# Patient Record
Sex: Male | Born: 1990 | Race: Black or African American | Hispanic: No | Marital: Single | Smoking: Current every day smoker
Health system: Southern US, Community
[De-identification: ages and names within clinical notes are randomized; demographics above are authoritative.]

## PROBLEM LIST (undated history)

## (undated) DIAGNOSIS — S2249XA Multiple fractures of ribs, unspecified side, initial encounter for closed fracture: Secondary | ICD-10-CM

## (undated) HISTORY — PX: OTHER SURGICAL HISTORY: SHX169

## (undated) HISTORY — DX: Multiple fractures of ribs, unspecified side, initial encounter for closed fracture: S22.49XA

---
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
## 2010-05-14 ENCOUNTER — Emergency Department (HOSPITAL_COMMUNITY)
Admission: EM | Admit: 2010-05-14 | Discharge: 2010-05-14 | Payer: Self-pay | Source: Home / Self Care | Admitting: Emergency Medicine

## 2010-08-06 LAB — BASIC METABOLIC PANEL
BUN: 12 mg/dL (ref 6–23)
CO2: 29 mEq/L (ref 19–32)
Calcium: 9.2 mg/dL (ref 8.4–10.5)
GFR calc non Af Amer: >60 mL/min (ref >60)
Glucose, Bld: 90 mg/dL (ref 70–99)

## 2010-08-06 LAB — HEPATIC FUNCTION PANEL
ALT: 16 U/L (ref 0–53)
Alkaline Phosphatase: 45 U/L (ref 39–117)
Indirect Bilirubin: 1.1 mg/dL — ABNORMAL HIGH (ref 0.3–0.9)
Total Protein: 7.4 g/dL (ref 6.0–8.3)

## 2010-08-06 LAB — URINALYSIS, ROUTINE W REFLEX MICROSCOPIC
Bilirubin Urine: NEGATIVE
Glucose, UA: NEGATIVE mg/dL
Hgb urine dipstick: NEGATIVE
Ketones, ur: 15 mg/dL — AB
Protein, ur: NEGATIVE mg/dL

## 2010-08-06 LAB — CBC
MCH: 34.2 pg — ABNORMAL HIGH (ref 26.0–34.0)
MCHC: 36.2 g/dL — ABNORMAL HIGH (ref 30.0–36.0)
RDW: 12 % (ref 11.5–15.5)

## 2010-08-06 LAB — DIFFERENTIAL
Eosinophils Relative: 1 % (ref 0–5)
Lymphs Abs: 0.5 10*3/uL — ABNORMAL LOW (ref 0.7–4.0)
Neutro Abs: 6.3 10*3/uL (ref 1.7–7.7)
Neutrophils Relative %: 87 % — ABNORMAL HIGH (ref 43–77)

## 2018-03-09 ENCOUNTER — Emergency Department (HOSPITAL_COMMUNITY): Payer: Self-pay

## 2018-03-09 ENCOUNTER — Encounter (HOSPITAL_COMMUNITY): Payer: Self-pay | Admitting: Emergency Medicine

## 2018-03-09 ENCOUNTER — Emergency Department (HOSPITAL_COMMUNITY)
Admission: EM | Admit: 2018-03-09 | Discharge: 2018-03-09 | Disposition: A | Discharge status: Home / Self Care | Payer: Self-pay | Attending: Emergency Medicine | Admitting: Emergency Medicine

## 2018-03-09 DIAGNOSIS — Y999 Unspecified external cause status: Secondary | ICD-10-CM | POA: Insufficient documentation

## 2018-03-09 DIAGNOSIS — W19XXXA Unspecified fall, initial encounter: Secondary | ICD-10-CM | POA: Insufficient documentation

## 2018-03-09 DIAGNOSIS — Z79899 Other long term (current) drug therapy: Secondary | ICD-10-CM | POA: Insufficient documentation

## 2018-03-09 DIAGNOSIS — Y929 Unspecified place or not applicable: Secondary | ICD-10-CM | POA: Insufficient documentation

## 2018-03-09 DIAGNOSIS — Z23 Encounter for immunization: Secondary | ICD-10-CM | POA: Insufficient documentation

## 2018-03-09 DIAGNOSIS — Y939 Activity, unspecified: Secondary | ICD-10-CM | POA: Insufficient documentation

## 2018-03-09 DIAGNOSIS — F17200 Nicotine dependence, unspecified, uncomplicated: Secondary | ICD-10-CM | POA: Insufficient documentation

## 2018-03-09 DIAGNOSIS — S0081XA Abrasion of other part of head, initial encounter: Secondary | ICD-10-CM | POA: Insufficient documentation

## 2018-03-09 DIAGNOSIS — S0990XA Unspecified injury of head, initial encounter: Secondary | ICD-10-CM

## 2018-03-09 LAB — COMPREHENSIVE METABOLIC PANEL
ALK PHOS: 38 U/L (ref 38–126)
ALT: 19 U/L (ref 0–44)
ANION GAP: 9 (ref 5–15)
AST: 24 U/L (ref 15–41)
Albumin: 4.2 g/dL (ref 3.5–5.0)
BILIRUBIN TOTAL: 0.9 mg/dL (ref 0.3–1.2)
BUN: 13 mg/dL (ref 6–20)
CALCIUM: 9.5 mg/dL (ref 8.9–10.3)
CO2: 31 mmol/L (ref 22–32)
CREATININE: 1.08 mg/dL (ref 0.61–1.24)
Chloride: 102 mmol/L (ref 98–111)
GFR calc non Af Amer: >60 mL/min (ref >60)
GLUCOSE: 121 mg/dL — AB (ref 70–99)
Potassium: 4.3 mmol/L (ref 3.5–5.1)
SODIUM: 142 mmol/L (ref 135–145)
TOTAL PROTEIN: 7.6 g/dL (ref 6.5–8.1)

## 2018-03-09 LAB — URINALYSIS, ROUTINE W REFLEX MICROSCOPIC
Bilirubin Urine: NEGATIVE
Glucose, UA: NEGATIVE mg/dL
Hgb urine dipstick: NEGATIVE
Ketones, ur: NEGATIVE mg/dL
LEUKOCYTES UA: NEGATIVE
NITRITE: NEGATIVE
PROTEIN: NEGATIVE mg/dL
Specific Gravity, Urine: 1.027 (ref 1.005–1.030)
pH: 6 (ref 5.0–8.0)

## 2018-03-09 LAB — RAPID URINE DRUG SCREEN, HOSP PERFORMED
Amphetamines: NOT DETECTED
BARBITURATES: NOT DETECTED
BENZODIAZEPINES: NOT DETECTED
Cocaine: NOT DETECTED
Opiates: NOT DETECTED
Tetrahydrocannabinol: POSITIVE — AB

## 2018-03-09 LAB — CBC WITH DIFFERENTIAL/PLATELET
ABS IMMATURE GRANULOCYTES: 0.02 10*3/uL (ref 0.00–0.07)
Basophils Absolute: 0 10*3/uL (ref 0.0–0.1)
Basophils Relative: 0 %
Eosinophils Absolute: 0 10*3/uL (ref 0.0–0.5)
Eosinophils Relative: 1 %
HEMATOCRIT: 46.8 % (ref 39.0–52.0)
HEMOGLOBIN: 16 g/dL (ref 13.0–17.0)
Immature Granulocytes: 0 %
LYMPHS ABS: 0.9 10*3/uL (ref 0.7–4.0)
LYMPHS PCT: 11 %
MCH: 32.9 pg (ref 26.0–34.0)
MCHC: 34.2 g/dL (ref 30.0–36.0)
MCV: 96.3 fL (ref 80.0–100.0)
MONOS PCT: 6 %
Monocytes Absolute: 0.5 10*3/uL (ref 0.1–1.0)
NEUTROS ABS: 6.6 10*3/uL (ref 1.7–7.7)
Neutrophils Relative %: 82 %
Platelets: 186 10*3/uL (ref 150–400)
RBC: 4.86 MIL/uL (ref 4.22–5.81)
RDW: 11.8 % (ref 11.5–15.5)
WBC: 8.1 10*3/uL (ref 4.0–10.5)
nRBC: 0 % (ref 0.0–0.2)

## 2018-03-09 LAB — ETHANOL

## 2018-03-09 MED ORDER — BACITRACIN ZINC 500 UNIT/GM EX OINT: 1.0000 "application " | TOPICAL_OINTMENT | Freq: Two times a day (BID) | CUTANEOUS | 0 refills | Status: DC

## 2018-03-09 MED ORDER — FLUORESCEIN SODIUM 1 MG OP STRP
1.0000 | ORAL_STRIP | Freq: Once | OPHTHALMIC | Status: AC
  Administered 2018-03-09: 1 via OPHTHALMIC
  Filled 2018-03-09: qty 1

## 2018-03-09 MED ORDER — HYDROCODONE-ACETAMINOPHEN 5-325 MG PO TABS
1.0000 | ORAL_TABLET | Freq: Once | ORAL | Status: AC
  Administered 2018-03-09: 1 via ORAL
  Filled 2018-03-09: qty 1

## 2018-03-09 MED ORDER — TETRACAINE HCL 0.5 % OP SOLN
2.0000 [drp] | Freq: Once | OPHTHALMIC | Status: AC
  Administered 2018-03-09: 2 [drp] via OPHTHALMIC
  Filled 2018-03-09: qty 4

## 2018-03-09 MED ORDER — SODIUM CHLORIDE 0.9 % IV BOLUS
1000.0000 mL | Freq: Once | INTRAVENOUS | Status: AC
  Administered 2018-03-09: 1000 mL via INTRAVENOUS

## 2018-03-09 MED ORDER — HYDROCODONE-ACETAMINOPHEN 5-325 MG PO TABS: 1.0000 | ORAL_TABLET | Freq: Four times a day (QID) | ORAL | 0 refills | Status: DC | PRN

## 2018-03-09 MED ORDER — TETANUS-DIPHTH-ACELL PERTUSSIS 5-2.5-18.5 LF-MCG/0.5 IM SUSP
0.5000 mL | Freq: Once | INTRAMUSCULAR | Status: AC
  Administered 2018-03-09: 0.5 mL via INTRAMUSCULAR
  Filled 2018-03-09: qty 0.5

## 2018-03-09 MED ORDER — CEPHALEXIN 500 MG PO CAPS: 500.0000 mg | ORAL_CAPSULE | Freq: Three times a day (TID) | ORAL | 0 refills | Status: DC

## 2018-03-09 NOTE — ED Triage Notes (Signed)
Per pt, states he fell "form exhaustion" last night-facial lacerations/abrasions to rigth side of face and right hand/knuckles-right eye, lip/mouth swollen-bleeding controlled at this time

## 2018-03-09 NOTE — ED Provider Notes (Signed)
[facility]-EMERGENCY DEPT Provider Note   CSN: [id number] Arrival date & time: 03/09/18  1058     History   Chief Complaint Chief Complaint  Patient presents with  . Fall    HPI Dillon Nichols is a 27 y.o. male.  HPI Patient complains of right-sided facial pain after falling earlier this morning around 3 AM.  States he felt lightheaded prior to fall.  Unknown loss of consciousness.  Supposedly there was a weakness to the event and patient had no seizure-like activity.  Sustained laceration under the right eye which mother cleaned with peroxide this morning.  Patient denies any eye pain or visual changes.  No nausea or vomiting.  No focal weakness or numbness.  Denies neck pain.  Unknown last tetanus. History reviewed. No pertinent past medical history.  There are no active problems to display for this patient.   History reviewed. No pertinent surgical history.      Home Medications    Prior to Admission medications   Medication Sig Start Date End Date Taking? Authorizing Provider  acetaminophen (TYLENOL) 500 MG tablet Take 500 mg by mouth daily as needed (pain).   Yes [provider]  bacitracin ointment Apply 1 application topically 2 (two) times daily. Apply to facial abrasions 03/09/18   Loren Racer, MD  cephALEXin (KEFLEX) 500 MG capsule Take 1 capsule (500 mg total) by mouth 3 (three) times daily. 03/09/18   Loren Racer, MD  HYDROcodone-acetaminophen (NORCO) 5-325 MG tablet Take 1 tablet by mouth every 6 (six) hours as needed for severe pain. 03/09/18   Loren Racer, MD    Family History No family history on file.  Social History Social History   Tobacco Use  . Smoking status: Current Some Day Smoker  Substance Use Topics  . Alcohol use: Yes  . Drug use: Not on file     Allergies   Patient has no allergy information on record.   Review of Systems Review of Systems  Constitutional: Negative for chills and  fever.  HENT: Positive for facial swelling. Negative for sore throat and trouble swallowing.   Eyes: Negative for photophobia, pain and visual disturbance.  Respiratory: Negative for shortness of breath.   Cardiovascular: Negative for chest pain.  Gastrointestinal: Negative for abdominal pain, diarrhea, nausea, rectal pain and vomiting.  Genitourinary: Negative for dysuria, flank pain and frequency.  Musculoskeletal: Negative for back pain, myalgias and neck pain.  Skin: Positive for wound.  Neurological: Positive for light-headedness and headaches. Negative for weakness and numbness.  All other systems reviewed and are negative.    Physical Exam Updated Vital Signs BP 125/80 (BP Location: Left Arm)   Pulse (!) 114   Temp 98.3 F (36.8 C) (Oral)   Resp 16   Ht 5\' 11"  (1.803 m)   Wt 72.6 kg   SpO2 95%   BMI 22.32 kg/m   Physical Exam  Constitutional: He is oriented to person, place, and time. He appears well-developed and well-nourished. No distress.  HENT:  Head: Normocephalic.  Mouth/Throat: Oropharynx is clear and moist.  Patient with roughly 6 to 7 centimeter of abraded tissue just inferior to the right lower eyelid.  Right upper lip swelling.  Midface is stable.  No malocclusion.  Patient also has a right forehead hematoma.  Eyes: Pupils are equal, round, and reactive to light. EOM are normal.  Pupils equal round reactive to light.  No hyphema present.  No foreign bodies visualized. No fluorescein uptake on Woods lamp  exam  Neck: Normal range of motion. Neck supple.  No posterior midline cervical tenderness to palpation.  Cardiovascular: Normal rate and regular rhythm.  Pulmonary/Chest: Effort normal and breath sounds normal.  Abdominal: Soft. Bowel sounds are normal. There is no tenderness. There is no rebound and no guarding.  Musculoskeletal: Normal range of motion. He exhibits no edema or tenderness.  Neurological: He is oriented to person, place, and time.  Drowsy  but responsive to voice.  5/5 motor in all extremities.  Sensation to light touch intact.  Skin: Skin is warm and dry. No rash noted. He is not diaphoretic. No erythema.  Small abrasions to the right hand.  Psychiatric: He has a normal mood and affect. His behavior is normal.  Nursing note and vitals reviewed.    ED Treatments / Results  Labs (all labs ordered are listed, but only abnormal results are displayed) Labs Reviewed  COMPREHENSIVE METABOLIC PANEL - Abnormal; Notable for the following components:      Result Value   Glucose, Bld 121 (*)    All other components within normal limits  RAPID URINE DRUG SCREEN, HOSP PERFORMED - Abnormal; Notable for the following components:   Tetrahydrocannabinol POSITIVE (*)    All other components within normal limits  CBC WITH DIFFERENTIAL/PLATELET  ETHANOL  URINALYSIS, ROUTINE W REFLEX MICROSCOPIC    EKG None  Radiology Ct Head Wo Contrast  Result Date: 03/09/2018 CLINICAL DATA:  27 year old male with a history of fall EXAM: CT HEAD WITHOUT CONTRAST CT MAXILLOFACIAL WITHOUT CONTRAST CT CERVICAL SPINE WITHOUT CONTRAST TECHNIQUE: Multidetector CT imaging of the head, cervical spine, and maxillofacial structures were performed using the standard protocol without intravenous contrast. Multiplanar CT image reconstructions of the cervical spine and maxillofacial structures were also generated. COMPARISON:  None. FINDINGS: CT HEAD FINDINGS Brain: No acute intracranial hemorrhage. No midline shift or mass effect. Gray-white differentiation maintained. Unremarkable appearance of the ventricular system. Vascular: Unremarkable. Skull: No acute fracture.  No aggressive bone lesion identified. Sinuses/Orbits: Unremarkable appearance of the orbits. Mastoid air cells clear. No middle ear effusion. No significant sinus disease. Other: None CT MAXILLOFACIAL FINDINGS Osseous: Age indeterminate nondisplaced fracture of the right nasal bone. No acute fracture of  the maxilla, mandible, zygoma or nasal septum. Orbits: Negative. No traumatic or inflammatory finding. Sinuses: Clear. Soft tissues: Soft tissue swelling in the right malar soft tissues. No radiopaque foreign body. No underlying maxillary bone fracture. At least 2 sites of dental caries. CT CERVICAL SPINE FINDINGS Alignment: Craniocervical junction aligned. Anatomic alignment of the cervical elements. No subluxation. Skull base and vertebrae: No acute fracture at the skullbase. Vertebral body heights relatively maintained. No acute fracture identified. Soft tissues and spinal canal: Unremarkable cervical soft tissues. Lymph nodes are present, though not enlarged. Disc levels: Unremarkable appearance of disc space, which are maintained. Upper chest: Unremarkable appearance of the lung apices. Other: No bony canal narrowing. IMPRESSION: Head CT: No acute intracranial abnormality. Cervical CT: No acute fracture malalignment of the cervical spine. Maxillofacial CT: Age-indeterminate nondisplaced fracture of the right nasal bone. Soft tissue swelling in the right malar soft tissues. At least 2 sites of dental caries. Electronically Signed   By: Gilmer Mor D.O.   On: 03/09/2018 14:00   Ct Cervical Spine Wo Contrast  Result Date: 03/09/2018 CLINICAL DATA:  27 year old male with a history of fall EXAM: CT HEAD WITHOUT CONTRAST CT MAXILLOFACIAL WITHOUT CONTRAST CT CERVICAL SPINE WITHOUT CONTRAST TECHNIQUE: Multidetector CT imaging of the head, cervical spine, and maxillofacial structures  were performed using the standard protocol without intravenous contrast. Multiplanar CT image reconstructions of the cervical spine and maxillofacial structures were also generated. COMPARISON:  None. FINDINGS: CT HEAD FINDINGS Brain: No acute intracranial hemorrhage. No midline shift or mass effect. Gray-white differentiation maintained. Unremarkable appearance of the ventricular system. Vascular: Unremarkable. Skull: No acute  fracture.  No aggressive bone lesion identified. Sinuses/Orbits: Unremarkable appearance of the orbits. Mastoid air cells clear. No middle ear effusion. No significant sinus disease. Other: None CT MAXILLOFACIAL FINDINGS Osseous: Age indeterminate nondisplaced fracture of the right nasal bone. No acute fracture of the maxilla, mandible, zygoma or nasal septum. Orbits: Negative. No traumatic or inflammatory finding. Sinuses: Clear. Soft tissues: Soft tissue swelling in the right malar soft tissues. No radiopaque foreign body. No underlying maxillary bone fracture. At least 2 sites of dental caries. CT CERVICAL SPINE FINDINGS Alignment: Craniocervical junction aligned. Anatomic alignment of the cervical elements. No subluxation. Skull base and vertebrae: No acute fracture at the skullbase. Vertebral body heights relatively maintained. No acute fracture identified. Soft tissues and spinal canal: Unremarkable cervical soft tissues. Lymph nodes are present, though not enlarged. Disc levels: Unremarkable appearance of disc space, which are maintained. Upper chest: Unremarkable appearance of the lung apices. Other: No bony canal narrowing. IMPRESSION: Head CT: No acute intracranial abnormality. Cervical CT: No acute fracture malalignment of the cervical spine. Maxillofacial CT: Age-indeterminate nondisplaced fracture of the right nasal bone. Soft tissue swelling in the right malar soft tissues. At least 2 sites of dental caries. Electronically Signed   By: Gilmer Mor D.O.   On: 03/09/2018 14:00   Ct Maxillofacial Wo Contrast  Result Date: 03/09/2018 CLINICAL DATA:  27 year old male with a history of fall EXAM: CT HEAD WITHOUT CONTRAST CT MAXILLOFACIAL WITHOUT CONTRAST CT CERVICAL SPINE WITHOUT CONTRAST TECHNIQUE: Multidetector CT imaging of the head, cervical spine, and maxillofacial structures were performed using the standard protocol without intravenous contrast. Multiplanar CT image reconstructions of the  cervical spine and maxillofacial structures were also generated. COMPARISON:  None. FINDINGS: CT HEAD FINDINGS Brain: No acute intracranial hemorrhage. No midline shift or mass effect. Gray-white differentiation maintained. Unremarkable appearance of the ventricular system. Vascular: Unremarkable. Skull: No acute fracture.  No aggressive bone lesion identified. Sinuses/Orbits: Unremarkable appearance of the orbits. Mastoid air cells clear. No middle ear effusion. No significant sinus disease. Other: None CT MAXILLOFACIAL FINDINGS Osseous: Age indeterminate nondisplaced fracture of the right nasal bone. No acute fracture of the maxilla, mandible, zygoma or nasal septum. Orbits: Negative. No traumatic or inflammatory finding. Sinuses: Clear. Soft tissues: Soft tissue swelling in the right malar soft tissues. No radiopaque foreign body. No underlying maxillary bone fracture. At least 2 sites of dental caries. CT CERVICAL SPINE FINDINGS Alignment: Craniocervical junction aligned. Anatomic alignment of the cervical elements. No subluxation. Skull base and vertebrae: No acute fracture at the skullbase. Vertebral body heights relatively maintained. No acute fracture identified. Soft tissues and spinal canal: Unremarkable cervical soft tissues. Lymph nodes are present, though not enlarged. Disc levels: Unremarkable appearance of disc space, which are maintained. Upper chest: Unremarkable appearance of the lung apices. Other: No bony canal narrowing. IMPRESSION: Head CT: No acute intracranial abnormality. Cervical CT: No acute fracture malalignment of the cervical spine. Maxillofacial CT: Age-indeterminate nondisplaced fracture of the right nasal bone. Soft tissue swelling in the right malar soft tissues. At least 2 sites of dental caries. Electronically Signed   By: Gilmer Mor D.O.   On: 03/09/2018 14:00    Procedures Procedures (including  critical care time)  Medications Ordered in ED Medications  Tdap  (BOOSTRIX) injection 0.5 mL (0.5 mLs Intramuscular Given 03/09/18 1312)  sodium chloride 0.9 % bolus 1,000 mL (0 mLs Intravenous Stopped 03/09/18 1346)  HYDROcodone-acetaminophen (NORCO/VICODIN) 5-325 MG per tablet 1 tablet (1 tablet Oral Given 03/09/18 1511)  tetracaine (PONTOCAINE) 0.5 % ophthalmic solution 2 drop (2 drops Right Eye Given 03/09/18 1522)  fluorescein ophthalmic strip 1 strip (1 strip Right Eye Given 03/09/18 1522)     Initial Impression / Assessment and Plan / ED Course  I have reviewed the triage vital signs and the nursing notes.  Pertinent labs & imaging results that were available during my care of the patient were reviewed by me and considered in my medical decision making (see chart for details).     Abrasion to the right face.  Wound was irrigated in the emergency department.  Not amenable to surgical closure given length of time since injury and also tissue loss.  Will start antibiotics and topical bacitracin.  CT head without acute findings.  Patient is been given head injury precautions and is voiced understanding.  Final Clinical Impressions(s) / ED Diagnoses   Final diagnoses:  Fall from standing, initial encounter  Facial abrasion, initial encounter  Closed head injury, initial encounter    ED Discharge Orders         Ordered    HYDROcodone-acetaminophen (NORCO) 5-325 MG tablet  Every 6 hours PRN     03/09/18 1535    cephALEXin (KEFLEX) 500 MG capsule  3 times daily     03/09/18 1535    bacitracin ointment  2 times daily     03/09/18 1535           Loren Racer, MD 03/09/18 1537

## 2018-04-16 ENCOUNTER — Encounter (INDEPENDENT_AMBULATORY_CARE_PROVIDER_SITE_OTHER): Payer: Self-pay | Admitting: Physician Assistant

## 2018-04-16 ENCOUNTER — Ambulatory Visit (INDEPENDENT_AMBULATORY_CARE_PROVIDER_SITE_OTHER): Payer: Self-pay | Admitting: Physician Assistant

## 2018-04-16 VITALS — BP 106/68 | HR 95 | Temp 98.0°F | Ht 71.0 in | Wt 155.0 lb

## 2018-04-16 DIAGNOSIS — R55 Syncope and collapse: Secondary | ICD-10-CM

## 2018-04-16 DIAGNOSIS — Z114 Encounter for screening for human immunodeficiency virus [HIV]: Secondary | ICD-10-CM

## 2018-04-16 DIAGNOSIS — Z09 Encounter for follow-up examination after completed treatment for conditions other than malignant neoplasm: Secondary | ICD-10-CM

## 2018-04-16 DIAGNOSIS — Z131 Encounter for screening for diabetes mellitus: Secondary | ICD-10-CM

## 2018-04-16 LAB — POCT GLYCOSYLATED HEMOGLOBIN (HGB A1C): HEMOGLOBIN A1C: 4.9 % (ref 4.0–5.6)

## 2018-04-16 NOTE — Progress Notes (Signed)
Subjective:  Patient ID: Dillon Nichols, male    DOB: March 14, 1991  Age: 27 y.o. MRN: [id number]  CC: hospital f/u  HPI Dillon Nichols is a 27 y.o. male with no significant medical history presents as a new patient on ED f/u. Went to ED five weeks ago after a syncopal episode with subsequent laceration under right eye. CT head, maxillofacial, and cervical spine unremarkable. UDS positive for THC. CBC, CMP, Ethanol, and UA normal/unremarkable. Laceration advised to heal under secondary intention. Discharged on cephalexin and bacitracin ointment. Pt took 3-4 days of Cephalexin. Does not feel his laceration is infected.     Pt's syncope occurred [place] [employer]. Does not think he was drugged at the club. Pt attributes syncope to exhaustion and possibly not eating three meals a day as he should. Also thinks he may be dehydrated. Has not had another syncopal episode since ED visit. Denies CP, palpitations, SOB, HA, tingling, numbness, abdominal pain, f/c/n/v, swelling, rash, or GI/GU sxs.        Outpatient Medications Prior to Visit  Medication Sig Dispense Refill  . bacitracin ointment Apply 1 application topically 2 (two) times daily. Apply to facial abrasions 120 g 0  . acetaminophen (TYLENOL) 500 MG tablet Take 500 mg by mouth daily as needed (pain).    . cephALEXin (KEFLEX) 500 MG capsule Take 1 capsule (500 mg total) by mouth 3 (three) times daily. 21 capsule 0  . HYDROcodone-acetaminophen (NORCO) 5-325 MG tablet Take 1 tablet by mouth every 6 (six) hours as needed for severe pain. 10 tablet 0   No facility-administered medications prior to visit.      ROS Review of Systems  Constitutional: Negative for chills, fever and malaise/fatigue.  Eyes: Negative for blurred vision.  Respiratory: Negative for shortness of breath.   Cardiovascular: Negative for chest pain and palpitations.  Gastrointestinal: Negative for abdominal pain and nausea.  Genitourinary: Negative for  dysuria and hematuria.  Musculoskeletal: Negative for joint pain and myalgias.  Skin: Negative for rash.  Neurological: Negative for tingling and headaches.  Psychiatric/Behavioral: Negative for depression. The patient is not nervous/anxious.     Objective:  Ht 5\' 11"  (1.803 m)   Wt 155 lb (70.3 kg)   BMI 21.62 kg/m   BP/Weight 04/16/2018 03/09/2018  Systolic BP - 116  Diastolic BP - 78  Wt. (Lbs) 155 160  BMI 21.62 22.32      Physical Exam  Constitutional: He is oriented to person, place, and time.  Well developed, well nourished, NAD, polite  HENT:  Head: Normocephalic and atraumatic.  Eyes: No scleral icterus.  Neck: Normal range of motion. Neck supple. No thyromegaly present.  Cardiovascular: Normal rate, regular rhythm and normal heart sounds.  Pulmonary/Chest: Effort normal and breath sounds normal.  Abdominal: Soft. Bowel sounds are normal. There is no tenderness.  Musculoskeletal: He exhibits no edema.  Neurological: He is alert and oriented to person, place, and time.  Skin: Skin is warm and dry. No rash noted. No erythema. No pallor.  Psychiatric: He has a normal mood and affect. His behavior is normal. Thought content normal.  Vitals reviewed.    Assessment & Plan:    1. Hospital discharge follow-up - Notes reviewed  2. Syncope and collapse - Work up thus far negative. See HPI.  - TSH  3. Screening for diabetes mellitus - HgB A1c 4.9  4. Screening for HIV (human immunodeficiency virus) - HIV Antibody (routine testing w rflx)     Follow-up:  Return in about 1 year (around 04/17/2019) for Full physical exam.   Loletta Specteroger David Kerrigan Gombos PA

## 2018-04-16 NOTE — Patient Instructions (Signed)
Syncope Syncope is when you lose temporarily pass out (faint). Signs that you may be about to pass out include:  Feeling dizzy or light-headed.  Feeling sick to your stomach (nauseous).  Seeing all white or all black.  Having cold, clammy skin.  If you passed out, get help right away. Call your local emergency services (911 in the [place] Do not drive yourself to the hospital. Follow these instructions at home: Pay attention to any changes in your symptoms. Take these actions to help with your condition:  Have someone stay with you until you feel stable.  Do not drive, use machinery, or play sports until your doctor says it is okay.  Keep all follow-up visits as told by your doctor. This is important.  If you start to feel like you might pass out, lie down right away and raise (elevate) your feet above the level of your heart. Breathe deeply and steadily. Wait until all of the symptoms are gone.  Drink enough fluid to keep your pee (urine) clear or pale yellow.  If you are taking blood pressure or heart medicine, get up slowly and spend many minutes getting ready to sit and then stand. This can help with dizziness.  Take over-the-counter and prescription medicines only as told by your doctor.  Get help right away if:  You have a very bad headache.  You have unusual pain in your chest, tummy, or back.  You are bleeding from your mouth or rectum.  You have black or tarry poop (stool).  You have a very fast or uneven heartbeat (palpitations).  It hurts to breathe.  You pass out once or more than once.  You have jerky movements that you cannot control (seizure).  You are confused.  You have trouble walking.  You are very weak.  You have vision problems. These symptoms may be an emergency. Do not wait to see if the symptoms will go away. Get medical help right away. Call your local emergency services (911 in the [place] Do not drive yourself to the hospital. This  information is not intended to replace advice given to you by your health care provider. Make sure you discuss any questions you have with your health care provider. Document Released: 10/30/2007 Document Revised: 10/19/2015 Document Reviewed: 01/25/2015 Elsevier Interactive Patient Education  2018 [employer]  

## 2018-04-17 ENCOUNTER — Telehealth (INDEPENDENT_AMBULATORY_CARE_PROVIDER_SITE_OTHER): Payer: Self-pay

## 2018-04-17 LAB — HIV ANTIBODY (ROUTINE TESTING W REFLEX): HIV SCREEN 4TH GENERATION: NONREACTIVE

## 2018-04-17 LAB — TSH: TSH: 1.38 u[IU]/mL (ref 0.450–4.500)

## 2018-04-17 NOTE — Telephone Encounter (Signed)
Patient verified DOB. He is aware that his thyroid is normal and HIV negative. Maryjean Mornempestt S Alfonzia Woolum, CMA

## 2018-04-17 NOTE — Telephone Encounter (Signed)
-----   Message from Loletta Specteroger David Gomez, PA-C sent at 04/17/2018  8:41 AM EST ----- Normal Thyroid. Negative HIV.

## 2019-03-12 ENCOUNTER — Other Ambulatory Visit: Payer: Self-pay

## 2019-03-12 DIAGNOSIS — Z20822 Contact with and (suspected) exposure to covid-19: Secondary | ICD-10-CM

## 2019-03-14 LAB — NOVEL CORONAVIRUS, NAA: SARS-CoV-2, NAA: DETECTED — AB

## 2019-03-16 ENCOUNTER — Other Ambulatory Visit: Payer: Self-pay

## 2019-03-16 ENCOUNTER — Encounter (HOSPITAL_COMMUNITY): Payer: Self-pay | Admitting: Obstetrics and Gynecology

## 2019-03-16 ENCOUNTER — Emergency Department (HOSPITAL_COMMUNITY): Payer: Self-pay

## 2019-03-16 ENCOUNTER — Emergency Department (HOSPITAL_COMMUNITY)
Admission: EM | Admit: 2019-03-16 | Discharge: 2019-03-16 | Disposition: A | Discharge status: Home / Self Care | Payer: Self-pay | Attending: Emergency Medicine | Admitting: Emergency Medicine

## 2019-03-16 DIAGNOSIS — U071 COVID-19: Secondary | ICD-10-CM | POA: Insufficient documentation

## 2019-03-16 DIAGNOSIS — F17210 Nicotine dependence, cigarettes, uncomplicated: Secondary | ICD-10-CM | POA: Insufficient documentation

## 2019-03-16 NOTE — Discharge Instructions (Addendum)
You need to make sure that you are quarantining at home from 10 days since the onset of your symptoms.  If you develop a fever all day 10 need to quarantine for another 3 days.  Drink plenty of water and orange juice.  Need to stay hydrated.  Tylenol for pain and fevers.  If your symptoms worsen return to the ER.

## 2019-03-16 NOTE — ED Provider Notes (Signed)
[facility]-EMERGENCY DEPT Provider Note   CSN: [id number] Arrival date & time: 03/16/19  1248     History   Chief Complaint Chief Complaint  Patient presents with  . COVID +    HPI Dillon Nichols is a 28 y.o. male.     HPI 28 year old African-American male with no pertinent past medical history presents to the emergency department today for evaluation after being tested for COVID-19 on 10/16.  Patient reports 4 days ago he woke up with loss of taste and smell.  He reports some mild shortness of breath along with nonproductive cough at that time.  He reports some nasal congestion and a headache.  Had a COVID-19 test performed that was positive.  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  Patient states he has been quarantining at home for the past 4 days.  He states that he woke up this morning was concerned that he needed to do the right thing to make sure that his mom did not get sick as she does work in [employer], [employer].  Patient states that he went outside this morning and felt a little bit lightheaded.  He also has some sharp pain to his nose with deep inspiration.  Patient reports ongoing cough but denies any significant shortness of breath today.  Patient denies any chest pain.  Denies any vision changes or headache.  No history of PE/DVT, prolonged immobilization, recent hospitalization/surgeries, unilateral leg swelling or calf tenderness.  Patient does report marijuana and tobacco use.  Denies any nausea, vomiting or diarrhea.  Denies any difficulties with swallowing or breathing.  Denies any abdominal pain.  He is taken no medications for his symptoms.  He states that he thinks he is lightheaded because he has not eaten anything today or last night.  Patient states that he does feel like his taste, smell and appetite is improving. No past medical history on file.  There are no active problems to display for this patient.    Past Surgical History:  Procedure Laterality Date  . Rib          Home Medications    Prior to Admission medications   Medication Sig Start Date End Date Taking? Authorizing Provider  bacitracin ointment Apply 1 application topically 2 (two) times daily. Apply to facial abrasions 03/09/18   Loren RacerYelverton, David, MD    Family History No family history on file.  Social History Social History   Tobacco Use  . Smoking status: Current Every Day Smoker    Packs/day: 0.50    Years: 15.00    Pack years: 7.50    Types: Cigarettes  . Smokeless tobacco: Never Used  Substance Use Topics  . Alcohol use: Yes    Comment: Social  . Drug use: Yes    Types: Marijuana    Comment: Weekly     Allergies   Patient has no known allergies.   Review of Systems Review of Systems  Constitutional: Negative for chills and fever.  HENT: Positive for congestion, rhinorrhea and sinus pain. Negative for sore throat.   Respiratory: Positive for cough and shortness of breath (resolved).   Cardiovascular: Negative for chest pain.  Gastrointestinal: Negative for abdominal pain, diarrhea, nausea and vomiting.  Genitourinary: Negative for dysuria.  Musculoskeletal: Negative for myalgias.  Skin: Negative for rash.  Neurological: Positive for headaches (resolved).  Psychiatric/Behavioral: Negative for confusion.     Physical Exam Updated Vital Signs BP 104/79 (BP Location: Right  Arm)   Pulse 89   Temp 99 F (37.2 C) (Oral)   Resp 16   SpO2 96%   Physical Exam Vitals signs and nursing note reviewed.  Constitutional:      General: He is not in acute distress.    Appearance: He is well-developed. He is not ill-appearing or toxic-appearing.  HENT:     Head: Normocephalic and atraumatic.     Mouth/Throat:     Mouth: Mucous membranes are moist.     Pharynx: Oropharynx is clear.  Eyes:     General: No scleral icterus.       Right eye: No discharge.        Left eye: No discharge.  Neck:      Musculoskeletal: Normal range of motion. No neck rigidity.  Cardiovascular:     Rate and Rhythm: Normal rate and regular rhythm.     Pulses: Normal pulses.     Heart sounds: Normal heart sounds. No murmur. No friction rub. No gallop.   Pulmonary:     Effort: Pulmonary effort is normal. No respiratory distress.     Breath sounds: Normal breath sounds. No stridor. No wheezing, rhonchi or rales.  Chest:     Chest wall: No tenderness.  Musculoskeletal: Normal range of motion.  Skin:    General: Skin is warm and dry.     Capillary Refill: Capillary refill takes less than 2 seconds.     Coloration: Skin is not pale.  Neurological:     Mental Status: He is alert.     Comments: Speech clear, follows commands.  Moves all extremities.  Ambulates with normal gait.  No Romberg.  Psychiatric:        Behavior: Behavior normal.        Thought Content: Thought content normal.        Judgment: Judgment normal.      ED Treatments / Results  Labs (all labs ordered are listed, but only abnormal results are displayed) Labs Reviewed - No data to display  EKG None  Radiology Dg Chest Portable 1 View  Result Date: 03/16/2019 CLINICAL DATA:  COVID-19. EXAM: PORTABLE CHEST 1 VIEW COMPARISON:  None. FINDINGS: The heart size and mediastinal contours are within normal limits. Both lungs are clear. The visualized skeletal structures are unremarkable. IMPRESSION: No active disease. Electronically Signed   By: Lupita Raider M.D.   On: 03/16/2019 14:00    Procedures Procedures (including critical care time)  Medications Ordered in ED Medications - No data to display   Initial Impression / Assessment and Plan / ED Course  I have reviewed the triage vital signs and the nursing notes.  Pertinent labs & imaging results that were available during my care of the patient were reviewed by me and considered in my medical decision making (see chart for details).        Dillon Nichols was evaluated  in Emergency Department on 03/16/2019 for the symptoms described in the history of present illness. He was evaluated in the context of the global COVID-19 pandemic, which necessitated consideration that the patient might be at risk for infection with the SARS-CoV-2 virus that causes COVID-19. Institutional protocols and algorithms that pertain to the evaluation of patients at risk for COVID-19 are in a state of rapid change based on information released by regulatory bodies including the CDC and federal and state organizations. These policies and algorithms were followed during the patient's care in the ED.  Patient tested positive for Covid  4 days ago.  Reports improving symptoms however he still reports some nasal congestion with sharp pain to his nose, lightheadedness and cough.  Patient did not want to get his mom sick at home.  States that he feels like he is dehydrated not eating or drinking anything.  He states that his appetite and taste has somewhat improved.  Patient denies any chest pain.  Vital signs are reassuring.  Orthostatics were negative as performed by myself.  Lungs clear to auscultation bilaterally.  Heart regular rate and rhythm.  Neurovascularly intact in all extremities.  No focal neurological deficits.  Chest x-ray showed no signs of pneumonia.  Patient able to eat and drink and feels improved after this.  Discussed quarantining for 10 days since onset of symptoms.  Patient is PERC negative and presentation does not seem consistent with PE or any other life-threatening emergency including ACS, CVA, ICH, myocarditis, endocarditis, dissection, pneumothorax.  Pt is hemodynamically stable, in NAD, & able to ambulate in the ED. Evaluation does not show pathology that would require ongoing emergent intervention or inpatient treatment. I explained the diagnosis to the patient. Pain has been managed & has no complaints prior to dc. Pt is comfortable with above plan and is stable for discharge at  this time. All questions were answered prior to disposition. Strict return precautions for f/u to the ED were discussed. Encouraged follow up with PCP.    Final Clinical Impressions(s) / ED Diagnoses   Final diagnoses:  [id number]    ED Discharge Orders    None       Aaron Edelman 03/16/19 1528    Sherwood Gambler, MD 03/16/19 [phone]

## 2019-03-16 NOTE — ED Triage Notes (Signed)
Patient presents to the ED after being tested COVID positive yesterday. Patient reports he is worried about getting his mom sick and that she works with elderly people and he lives with her and became concerned. Patient reports he had lost his taste and smell, but it is starting to come back. Patient is alert and oriented.  Patient states he came here to quarantine and "take the steps to not get others sick"

## 2019-03-26 ENCOUNTER — Other Ambulatory Visit: Payer: Self-pay

## 2019-03-26 DIAGNOSIS — Z20822 Contact with and (suspected) exposure to covid-19: Secondary | ICD-10-CM

## 2019-03-27 LAB — NOVEL CORONAVIRUS, NAA: SARS-CoV-2, NAA: NOT DETECTED

## 2019-03-31 ENCOUNTER — Telehealth (INDEPENDENT_AMBULATORY_CARE_PROVIDER_SITE_OTHER): Payer: Self-pay | Admitting: Primary Care

## 2020-12-10 ENCOUNTER — Ambulatory Visit (HOSPITAL_COMMUNITY)
Admission: EM | Admit: 2020-12-10 | Discharge: 2020-12-10 | Disposition: A | Discharge status: Home / Self Care | Payer: Self-pay | Attending: Physician Assistant | Admitting: Physician Assistant

## 2020-12-10 ENCOUNTER — Encounter (HOSPITAL_COMMUNITY): Payer: Self-pay

## 2020-12-10 ENCOUNTER — Other Ambulatory Visit: Payer: Self-pay

## 2020-12-10 DIAGNOSIS — B029 Zoster without complications: Secondary | ICD-10-CM

## 2020-12-10 MED ORDER — GABAPENTIN 300 MG PO CAPS: 300.0000 mg | ORAL_CAPSULE | Freq: Every day | ORAL | 0 refills | Status: DC

## 2020-12-10 MED ORDER — VALACYCLOVIR HCL 1 G PO TABS: 1000.0000 mg | ORAL_TABLET | Freq: Three times a day (TID) | ORAL | 0 refills | Status: AC

## 2020-12-10 NOTE — Discharge Instructions (Addendum)
Take Valtrex (valacyclovir) 3 times a day for 7 days.  This is an antiviral that will help clear the infection sooner.  Use gabapentin at night.  This will help with burning sensation and some pain.  This will make you sleepy do not drive or drink alcohol while taking it.  You can use Tylenol and ibuprofen over-the-counter for additional pain relief.  If you have any spread of rash or additional symptoms including fever, nausea, vomiting you need to be reevaluated.  Please avoid contact with anybody with a suppressed immune system such as pregnant women, people on chemotherapy, older individuals until lesions are completely healed.

## 2020-12-10 NOTE — ED Provider Notes (Signed)
[facility]    CSN: [id number] Arrival date & time: 12/10/20  1022      History   Chief Complaint Chief Complaint  Patient presents with   Rash    HPI Eugen Jeansonne is a 30 y.o. male.   Patient presents today with 2-day history of painful rash.  Reports symptoms are localized to area at the base of his back with radiation onto buttocks.  He has tried applying alcohol this caused worsening of symptoms.  He reports a burning/intense itching sensation associated with rash.  He denies history of shingles but did have chickenpox as a child.  He denies episodes of similar symptoms in the past.  Denies additional symptoms including fever, nausea, vomiting, congestion, cough.  He denies any recent medication changes, recent travel, contact with anyone with similar symptoms, exposure to insects or plants, unusual food intake.  Denies history of dermatological condition.   History reviewed. No pertinent past medical history.  There are no problems to display for this patient.   Past Surgical History:  Procedure Laterality Date   Rib         Home Medications    Prior to Admission medications   Medication Sig Start Date End Date Taking? Authorizing Provider  gabapentin (NEURONTIN) 300 MG capsule Take 1 capsule (300 mg total) by mouth at bedtime. 12/10/20  Yes Andersson Larrabee, Noberto Retort, PA-C  valACYclovir (VALTREX) 1000 MG tablet Take 1 tablet (1,000 mg total) by mouth 3 (three) times daily for 7 days. 12/10/20 12/17/20 Yes Aleathia Purdy K, PA-C  bacitracin ointment Apply 1 application topically 2 (two) times daily. Apply to facial abrasions 03/09/18   Loren Racer, MD    Family History Family History  Family history unknown: Yes    Social History Social History   Tobacco Use   Smoking status: Every Day    Packs/day: 0.50    Years: 15.00    Pack years: 7.50    Types: Cigarettes   Smokeless tobacco: Never  Vaping Use   Vaping Use: Former  Substance Use Topics    Alcohol use: Yes    Comment: Social   Drug use: Yes    Types: Marijuana    Comment: Weekly     Allergies   Patient has no known allergies.   Review of Systems Review of Systems  Constitutional:  Negative for activity change, appetite change, fatigue and fever.  Respiratory:  Negative for cough and shortness of breath.   Cardiovascular:  Negative for chest pain.  Gastrointestinal:  Negative for abdominal pain, diarrhea, nausea and vomiting.  Musculoskeletal:  Negative for arthralgias and myalgias.  Skin:  Positive for rash.  Neurological:  Negative for dizziness, weakness, light-headedness, numbness and headaches.    Physical Exam Triage Vital Signs ED Triage Vitals  Enc Vitals Group     BP 12/10/20 1054 122/77     Pulse Rate 12/10/20 1054 67     Resp 12/10/20 1054 17     Temp 12/10/20 1054 98.2 F (36.8 C)     Temp Source 12/10/20 1054 Oral     SpO2 12/10/20 1054 97 %     Weight --      Height --      Head Circumference --      Peak Flow --      Pain Score 12/10/20 1053 0     Pain Loc --      Pain Edu? --      Excl. in GC? --  No data found.  Updated Vital Signs BP 122/77 (BP Location: Right Arm)   Pulse 67   Temp 98.2 F (36.8 C) (Oral)   Resp 17   SpO2 97%   Visual Acuity Right Eye Distance:   Left Eye Distance:   Bilateral Distance:    Right Eye Near:   Left Eye Near:    Bilateral Near:     Physical Exam Vitals reviewed.  Constitutional:      General: He is awake.     Appearance: Normal appearance. He is normal weight. He is not ill-appearing.     Comments: Very pleasant male appears stated age in no acute distress sitting comfortably in exam room  HENT:     Head: Normocephalic and atraumatic.     Mouth/Throat:     Pharynx: Uvula midline. No oropharyngeal exudate or posterior oropharyngeal erythema.  Cardiovascular:     Rate and Rhythm: Normal rate and regular rhythm.     Heart sounds: Normal heart sounds, S1 normal and S2 normal. No  murmur heard. Pulmonary:     Effort: Pulmonary effort is normal.     Breath sounds: Normal breath sounds. No stridor. No wheezing, rhonchi or rales.     Comments: Clear to auscultation bilaterally Skin:    Findings: Rash present. Rash is vesicular.          Comments: Vesicular rash in S3 dermatome.  No streaking or evidence of lymphangitis.  Area is tender to touch.  No bleeding or drainage noted.  Neurological:     Mental Status: He is alert.  Psychiatric:        Behavior: Behavior is cooperative.     UC Treatments / Results  Labs (all labs ordered are listed, but only abnormal results are displayed) Labs Reviewed - No data to display  EKG   Radiology No results found.  Procedures Procedures (including critical care time)  Medications Ordered in UC Medications - No data to display  Initial Impression / Assessment and Plan / UC Course  I have reviewed the triage vital signs and the nursing notes.  Pertinent labs & imaging results that were available during my care of the patient were reviewed by me and considered in my medical decision making (see chart for details).      Symptoms are consistent with shingles.  Patient was started on Valtrex 1000 mg 3 times daily for 7 days given symptoms been present for less than 72 hours.  He was prescribed gabapentin 300 mg to use at night to help with neuralgia.  Recommend he use over-the-counter medications including Tylenol ibuprofen for pain relief.  Encouraged him to keep area covered.  Discussed the importance of keeping area clean to prevent secondary infection.  He was instructed to avoid contact with anyone with a suppressed immune system including young children and pregnant individuals.  Discussed alarm symptoms that warrant emergent evaluation.  Strict return precautions given to patient expressed understanding.  Final Clinical Impressions(s) / UC Diagnoses   Final diagnoses:  Herpes zoster without complication      Discharge Instructions      Take Valtrex (valacyclovir) 3 times a day for 7 days.  This is an antiviral that will help clear the infection sooner.  Use gabapentin at night.  This will help with burning sensation and some pain.  This will make you sleepy do not drive or drink alcohol while taking it.  You can use Tylenol and ibuprofen over-the-counter for additional pain relief.  If you  have any spread of rash or additional symptoms including fever, nausea, vomiting you need to be reevaluated.  Please avoid contact with anybody with a suppressed immune system such as pregnant women, people on chemotherapy, older individuals until lesions are completely healed.     ED Prescriptions     Medication Sig Dispense Auth. Provider   valACYclovir (VALTREX) 1000 MG tablet Take 1 tablet (1,000 mg total) by mouth 3 (three) times daily for 7 days. 21 tablet Elenora Hawbaker K, PA-C   gabapentin (NEURONTIN) 300 MG capsule Take 1 capsule (300 mg total) by mouth at bedtime. 10 capsule Seri Kimmer, Noberto Retort, PA-C      PDMP not reviewed this encounter.   Jeani Hawking, PA-C 12/10/20 1121

## 2020-12-10 NOTE — ED Triage Notes (Signed)
Pt presents with c/o a rash on the lower back X 2 days.   States he has applied rubbing alcohol on the area that caused irritation, burning and itching.

## 2021-02-06 IMAGING — DX DG CHEST 1V PORT
1 series · 1 of 1 positions shown · non-contrast
Comparison: None.

CLINICAL DATA: [id number].

EXAM:
PORTABLE CHEST 1 VIEW

[chest ap]
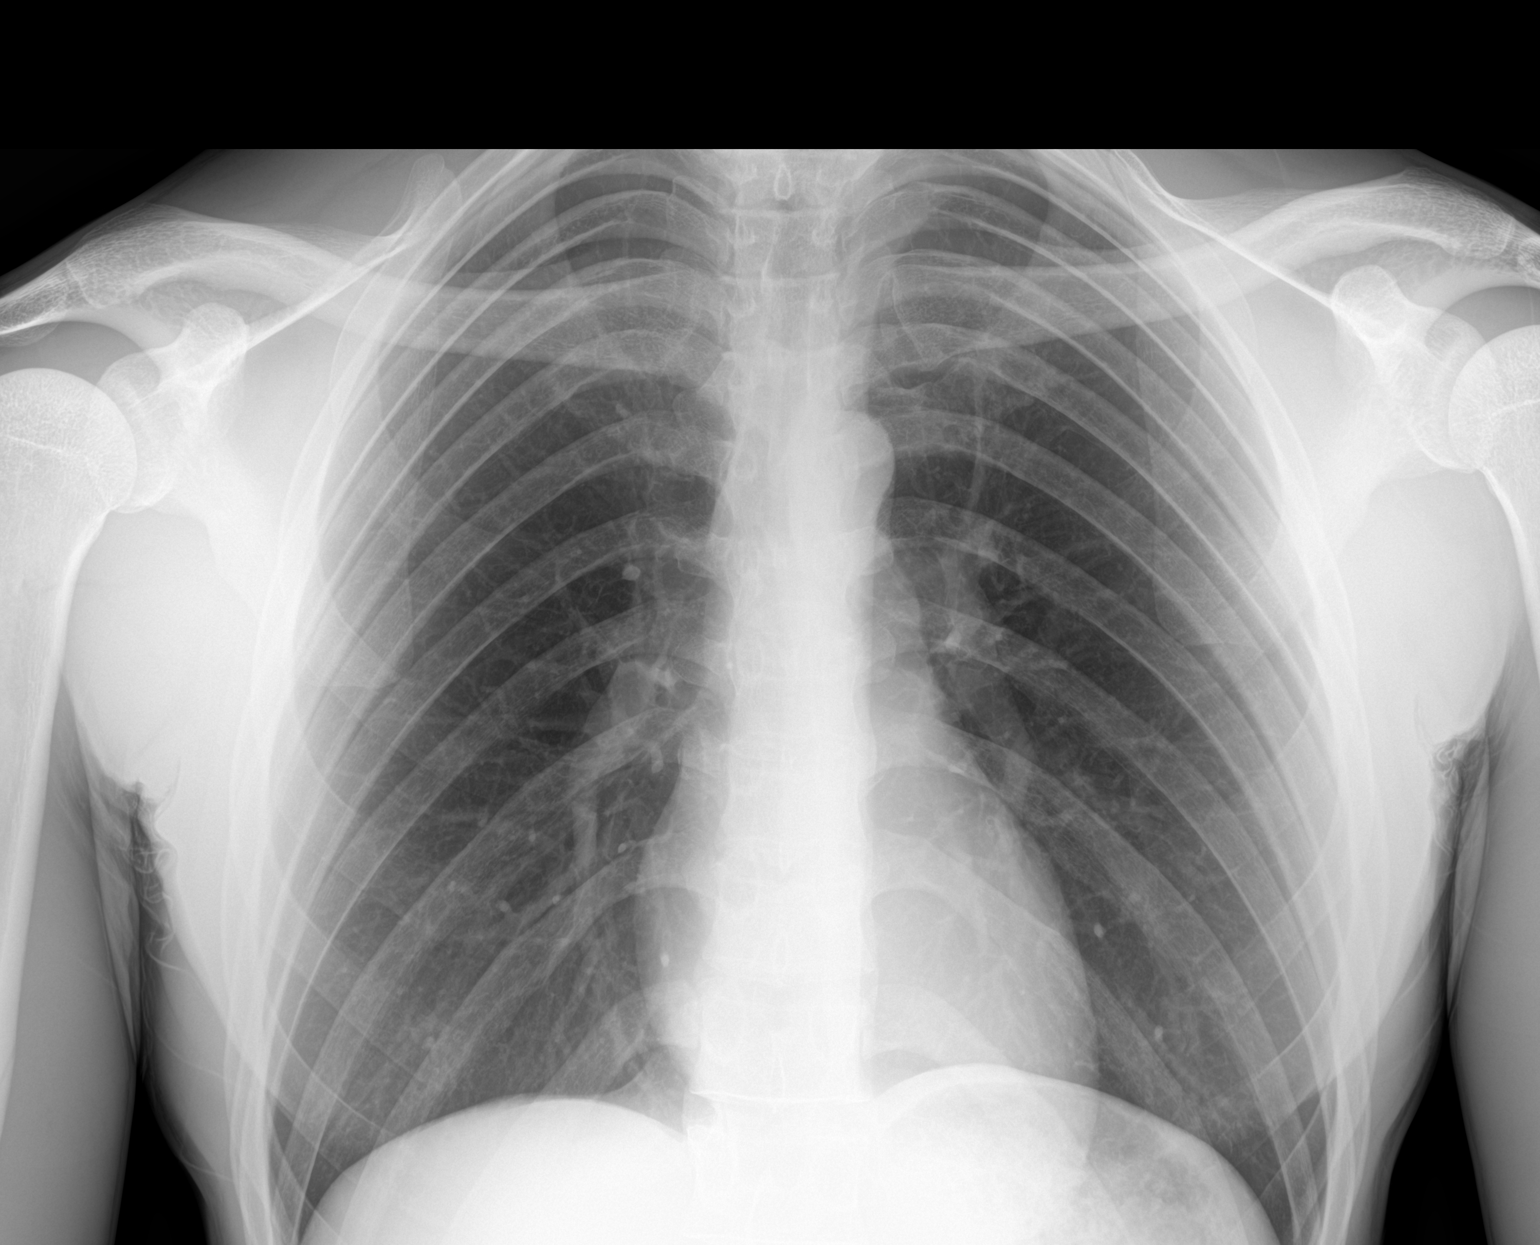

[1 of 1 positions shown; findings below may reference images not displayed]

FINDINGS: The heart size and mediastinal contours are within normal limits.
Both lungs are clear. The visualized skeletal structures are
unremarkable.
IMPRESSION: No active disease.

## 2021-09-21 ENCOUNTER — Ambulatory Visit: Payer: Self-pay | Attending: Nurse Practitioner | Admitting: Nurse Practitioner

## 2021-09-21 ENCOUNTER — Encounter: Payer: Self-pay | Admitting: Nurse Practitioner

## 2021-09-21 VITALS — BP 111/76 | HR 58 | Wt 152.2 lb

## 2021-09-21 DIAGNOSIS — Z7251 High risk heterosexual behavior: Secondary | ICD-10-CM

## 2021-09-21 DIAGNOSIS — Z833 Family history of diabetes mellitus: Secondary | ICD-10-CM

## 2021-09-21 DIAGNOSIS — Z1159 Encounter for screening for other viral diseases: Secondary | ICD-10-CM

## 2021-09-21 DIAGNOSIS — Z130 Encounter for screening for diseases of the blood and blood-forming organs and certain disorders involving the immune mechanism: Secondary | ICD-10-CM

## 2021-09-21 DIAGNOSIS — Z8349 Family history of other endocrine, nutritional and metabolic diseases: Secondary | ICD-10-CM

## 2021-09-21 DIAGNOSIS — Z7689 Persons encountering health services in other specified circumstances: Secondary | ICD-10-CM

## 2021-09-21 NOTE — Progress Notes (Signed)
es ? ?Assessment & Plan:  ?Dillon Nichols was seen today for new patient (initial visit). ? ?Diagnoses and all orders for this visit: ? ?Encounter to establish care ? ?Family history of thyroid disease in mother ?-     Thyroid Panel With TSH ? ?Family history of diabetes mellitus in mother ?-     CMP14+EGFR ?-     Hemoglobin A1c ? ?High risk sexual behavior, unspecified type ?-     HIV antibody (with reflex) ?-     RPR ?-     Urine cytology ancillary only ? ?Need for hepatitis C screening test ?-     HCV Ab w Reflex to Quant PCR ? ?Screening for deficiency anemia ?-     CBC with Differential ? ? ? ?Patient has been counseled on age-appropriate routine health concerns for screening and prevention. These are reviewed and up-to-date. Referrals have been placed accordingly. Immunizations are up-to-date or declined.    ?Subjective:  ? ?Chief Complaint  ?Patient presents with  ? New Patient (Initial Visit)  ? ?HPI ?Dillon Nichols 31 y.o. male presents to office today to establish care.  He has no significant past medical history aside from rib fractures from many years ago when he was incarcerated. ? ?He is requesting "to get checked out" and also asking for a full STD panel. ?He denies any recent disclosure of STD transmission from any sexual partners ? ? ?Requesting a referral to the dentist however he is currently uninsured. Patient was urged to apply for the financial assistance program.  They were instructed to inquire at the front desk about the application process for the [employer] discount, orange card or other financial assistance.     ? ?Review of Systems  ?Constitutional:  Negative for fever, malaise/fatigue and weight loss.  ?HENT: Negative.  Negative for nosebleeds.   ?Eyes: Negative.  Negative for blurred vision, double vision and photophobia.  ?Respiratory: Negative.  Negative for cough and shortness of breath.   ?Cardiovascular: Negative.  Negative for chest pain, palpitations and leg swelling.   ?Gastrointestinal: Negative.  Negative for heartburn, nausea and vomiting.  ?Musculoskeletal: Negative.  Negative for myalgias.  ?Neurological: Negative.  Negative for dizziness, focal weakness, seizures and headaches.  ?Psychiatric/Behavioral: Negative.  Negative for suicidal ideas.   ? ?Past Medical History:  ?Diagnosis Date  ? Rib fractures   ? ? ?No past surgical history on file. ? ? ?Family History  ?Problem Relation Age of Onset  ? Hypertension Mother   ? Diabetes Mother   ? Thyroid disease Mother   ? Birth defects Maternal Uncle   ? Cancer Maternal Grandmother   ? Diabetes Maternal Grandmother   ? ? ?Social History Reviewed with no changes to be made today.  ? ?Outpatient Medications Prior to Visit  ?Medication Sig Dispense Refill  ? bacitracin ointment Apply 1 application topically 2 (two) times daily. Apply to facial abrasions (Patient not taking: Reported on 09/21/2021) 120 g 0  ? gabapentin (NEURONTIN) 300 MG capsule Take 1 capsule (300 mg total) by mouth at bedtime. (Patient not taking: Reported on 09/21/2021) 10 capsule 0  ? ?No facility-administered medications prior to visit.  ? ? ?No Known Allergies ? ?   ?Objective:  ?  ?BP 111/76   Pulse (!) 58   Wt 152 lb 3.2 oz (69 kg)   SpO2 99%   BMI 21.23 kg/m?  ?Wt Readings from Last 3 Encounters:  ?09/21/21 152 lb 3.2 oz (69 kg)  ?04/16/18 155 lb (70.3  kg)  ?03/09/18 160 lb (72.6 kg)  ? ? ?Physical Exam ?Vitals and nursing note reviewed.  ?Constitutional:   ?   Appearance: He is well-developed.  ?HENT:  ?   Head: Normocephalic and atraumatic.  ?Cardiovascular:  ?   Rate and Rhythm: Regular rhythm. Bradycardia present.  ?   Heart sounds: Normal heart sounds. No murmur heard. ?  No friction rub. No gallop.  ?Pulmonary:  ?   Effort: Pulmonary effort is normal. No tachypnea or respiratory distress.  ?   Breath sounds: Normal breath sounds. No decreased breath sounds, wheezing, rhonchi or rales.  ?Chest:  ?   Chest wall: No tenderness.  ?Abdominal:  ?    General: Bowel sounds are normal.  ?   Palpations: Abdomen is soft.  ?Musculoskeletal:     ?   General: Normal range of motion.  ?   Cervical back: Normal range of motion.  ?Skin: ?   General: Skin is warm and dry.  ?Neurological:  ?   Mental Status: He is alert and oriented to person, place, and time.  ?   Coordination: Coordination normal.  ?Psychiatric:     ?   Behavior: Behavior normal. Behavior is cooperative.     ?   Thought Content: Thought content normal.     ?   Judgment: Judgment normal.  ? ? ? ? ?   ?Patient has been counseled extensively about nutrition and exercise as well as the importance of adherence with medications and regular follow-up. The patient was given clear instructions to go to ER or return to medical center if symptoms don't improve, worsen or new problems develop. The patient verbalized understanding.  ? ?Follow-up: Return in about 6 months (around 03/23/2022), or if symptoms worsen or fail to improve, for Needs Financial assistance paperwork.  ? ?Gildardo Pounds, FNP-BC ?[place] ?[place], [place] ?[phone]   ?09/21/2021, 1:36 PM ?

## 2021-09-22 LAB — CMP14+EGFR
ALT: 18 IU/L (ref 0–44)
AST: 21 IU/L (ref 0–40)
Albumin/Globulin Ratio: 1.5 (ref 1.2–2.2)
Albumin: 4.4 g/dL (ref 4.0–5.0)
Alkaline Phosphatase: 60 IU/L (ref 44–121)
BUN/Creatinine Ratio: 9 (ref 9–20)
BUN: 10 mg/dL (ref 6–20)
Bilirubin Total: 0.4 mg/dL (ref 0.0–1.2)
CO2: 26 mmol/L (ref 20–29)
Calcium: 9.5 mg/dL (ref 8.7–10.2)
Chloride: 100 mmol/L (ref 96–106)
Creatinine, Ser: 1.14 mg/dL (ref 0.76–1.27)
Globulin, Total: 2.9 g/dL (ref 1.5–4.5)
Glucose: 86 mg/dL (ref 70–99)
Potassium: 4 mmol/L (ref 3.5–5.2)
Sodium: 141 mmol/L (ref 134–144)
Total Protein: 7.3 g/dL (ref 6.0–8.5)
eGFR: 88 mL/min/{1.73_m2} (ref >59)

## 2021-09-22 LAB — CBC WITH DIFFERENTIAL/PLATELET
Basophils Absolute: 0 10*3/uL (ref 0.0–0.2)
Basos: 0 %
EOS (ABSOLUTE): 0.1 10*3/uL (ref 0.0–0.4)
Eos: 2 %
Hematocrit: 45.9 % (ref 37.5–51.0)
Hemoglobin: 16.2 g/dL (ref 13.0–17.7)
Immature Grans (Abs): 0 10*3/uL (ref 0.0–0.1)
Immature Granulocytes: 0 %
Lymphocytes Absolute: 1.3 10*3/uL (ref 0.7–3.1)
Lymphs: 29 %
MCH: 33.8 pg — ABNORMAL HIGH (ref 26.6–33.0)
MCHC: 35.3 g/dL (ref 31.5–35.7)
MCV: 96 fL (ref 79–97)
Monocytes Absolute: 0.4 10*3/uL (ref 0.1–0.9)
Monocytes: 9 %
Neutrophils Absolute: 2.6 10*3/uL (ref 1.4–7.0)
Neutrophils: 60 %
Platelets: 183 10*3/uL (ref 150–450)
RBC: 4.8 x10E6/uL (ref 4.14–5.80)
RDW: 11.8 % (ref 11.6–15.4)
WBC: 4.5 10*3/uL (ref 3.4–10.8)

## 2021-09-22 LAB — HCV AB W REFLEX TO QUANT PCR: HCV Ab: NONREACTIVE

## 2021-09-22 LAB — THYROID PANEL WITH TSH
Free Thyroxine Index: 2.1 (ref 1.2–4.9)
T3 Uptake Ratio: 29 % (ref 24–39)
T4, Total: 7.4 ug/dL (ref 4.5–12.0)
TSH: 1.83 u[IU]/mL (ref 0.450–4.500)

## 2021-09-22 LAB — HEMOGLOBIN A1C
Est. average glucose Bld gHb Est-mCnc: 97 mg/dL
Hgb A1c MFr Bld: 5 % (ref 4.8–5.6)

## 2021-09-22 LAB — HCV INTERPRETATION

## 2021-09-22 LAB — HIV ANTIBODY (ROUTINE TESTING W REFLEX): HIV Screen 4th Generation wRfx: NONREACTIVE

## 2021-09-22 LAB — RPR: RPR Ser Ql: NONREACTIVE

## 2021-09-24 ENCOUNTER — Other Ambulatory Visit: Payer: Self-pay

## 2022-03-22 ENCOUNTER — Ambulatory Visit: Payer: Self-pay | Admitting: Nurse Practitioner

## 2023-06-02 ENCOUNTER — Ambulatory Visit (HOSPITAL_COMMUNITY)
Admission: EM | Admit: 2023-06-02 | Discharge: 2023-06-02 | Disposition: A | Discharge status: Home / Self Care | Payer: Medicaid Other | Attending: Family Medicine | Admitting: Family Medicine

## 2023-06-02 ENCOUNTER — Encounter (HOSPITAL_COMMUNITY): Payer: Self-pay | Admitting: Emergency Medicine

## 2023-06-02 DIAGNOSIS — A64 Unspecified sexually transmitted disease: Secondary | ICD-10-CM | POA: Insufficient documentation

## 2023-06-02 MED ORDER — AZITHROMYCIN 250 MG PO TABS
ORAL_TABLET | ORAL | Status: AC
  Filled 2023-06-02: qty 4

## 2023-06-02 MED ORDER — AZITHROMYCIN 250 MG PO TABS
1000.0000 mg | ORAL_TABLET | Freq: Once | ORAL | Status: AC
  Administered 2023-06-02: 1000 mg via ORAL

## 2023-06-02 MED ORDER — CEFTRIAXONE SODIUM 500 MG IJ SOLR
INTRAMUSCULAR | Status: AC
  Filled 2023-06-02: qty 500

## 2023-06-02 MED ORDER — LIDOCAINE HCL (PF) 1 % IJ SOLN
INTRAMUSCULAR | Status: AC
  Filled 2023-06-02: qty 2

## 2023-06-02 MED ORDER — CEFTRIAXONE SODIUM 500 MG IJ SOLR
500.0000 mg | INTRAMUSCULAR | Status: AC
  Administered 2023-06-02: 500 mg via INTRAMUSCULAR

## 2023-06-02 NOTE — ED Provider Notes (Signed)
 [facility]    CSN: [id number] Arrival date & time: 06/02/23  1528      History   Chief Complaint Chief Complaint  Patient presents with   SEXUALLY TRANSMITTED DISEASE    HPI Karim Aiello is a 33 y.o. male.   Patient is reporting yellow white penile discharge.  Burning with urination.  Tingling and pain baseline without urination at this time.      Past Medical History:  Diagnosis Date   Rib fractures     There are no active problems to display for this patient.   History reviewed. No pertinent surgical history.     Home Medications    Prior to Admission medications   Not on File    Family History Family History  Problem Relation Age of Onset   Hypertension Mother    Diabetes Mother    Thyroid  disease Mother    Birth defects Maternal Uncle    Cancer Maternal Grandmother    Diabetes Maternal Grandmother     Social History Social History   Tobacco Use   Smoking status: Every Day    Current packs/day: 0.50    Average packs/day: 0.5 packs/day for 15.0 years (7.5 ttl pk-yrs)    Types: Cigarettes   Smokeless tobacco: Never  Vaping Use   Vaping status: Former  Substance Use Topics   Alcohol use: Yes    Comment: Social   Drug use: Yes    Types: Marijuana    Comment: Weekly     Allergies   Patient has no known allergies.   Review of Systems Review of Systems  Genitourinary:  Positive for dysuria, penile discharge and penile pain. Negative for penile swelling, scrotal swelling and testicular pain.  All other systems reviewed and are negative.    Physical Exam Triage Vital Signs ED Triage Vitals  Encounter Vitals Group     BP      Systolic BP Percentile      Diastolic BP Percentile      Pulse      Resp      Temp      Temp src      SpO2      Weight      Height      Head Circumference      Peak Flow      Pain Score      Pain Loc      Pain Education      Exclude from Growth Chart    No data found.  Updated Vital  Signs BP 132/76 (BP Location: Left Arm)   Pulse 65   Temp 98.4 F (36.9 C) (Oral)   Resp 18   SpO2 97%   Visual Acuity Right Eye Distance:   Left Eye Distance:   Bilateral Distance:    Right Eye Near:   Left Eye Near:    Bilateral Near:     Physical Exam Vitals and nursing note reviewed.  Constitutional:      Appearance: Normal appearance.  Genitourinary:    Penis: Normal.      Testes: Normal.     Comments: Physical exam deferred Reported symptoms of discharge, yellow/white Skin:    Findings: No rash.      UC Treatments / Results  Labs (all labs ordered are listed, but only abnormal results are displayed) Labs Reviewed  CYTOLOGY, (ORAL, ANAL, URETHRAL) ANCILLARY ONLY    EKG   Radiology No results found.  Procedures Procedures (including critical care time)  Medications Ordered in UC Medications  cefTRIAXone  (ROCEPHIN ) injection 500 mg (has no administration in time range)  azithromycin  (ZITHROMAX ) tablet 1,000 mg (has no administration in time range)    Initial Impression / Assessment and Plan / UC Course  I have reviewed the triage vital signs and the nursing notes.  Pertinent labs & imaging results that were available during my care of the patient were reviewed by me and considered in my medical decision making (see chart for details).   Patient is reporting penile discharge with pain on urination.  Baseline tingling and burning reported.  Symptoms started 2 days ago.  We will treat for gonorrhea and chlamydia at this time.  Swab completed.  We will call is rocephin  and azithromycin  are not effect treatments.  Education given on abstaining from sex at this time.     Final Clinical Impressions(s) / UC Diagnoses   Final diagnoses:  STD (male)     Discharge Instructions      You have been treated for chlamydia and gonorrhea.  If testing comes back as different infection we will call you.       ED Prescriptions   None    PDMP not reviewed  this encounter.   Sumner Marval HERO, NP 06/02/23 [phone]

## 2023-06-02 NOTE — Discharge Instructions (Addendum)
 You have been treated for chlamydia and gonorrhea.  If testing comes back as different infection we will call you.

## 2023-06-02 NOTE — ED Triage Notes (Signed)
 Pt c/o white penile discharge with odor x2 days and burning on urination.

## 2023-06-04 LAB — CYTOLOGY, (ORAL, ANAL, URETHRAL) ANCILLARY ONLY
Chlamydia: NEGATIVE
Comment: NEGATIVE
Comment: NEGATIVE
Comment: NORMAL
Neisseria Gonorrhea: POSITIVE — AB
Trichomonas: NEGATIVE
